# Patient Record
Sex: Female | Born: 1965 | Race: White | Hispanic: No | Marital: Married | Smoking: Never smoker
Health system: Southern US, Community
[De-identification: ages and names within clinical notes are randomized; demographics above are authoritative.]

## PROBLEM LIST (undated history)

## (undated) DIAGNOSIS — I10 Essential (primary) hypertension: Secondary | ICD-10-CM

## (undated) DIAGNOSIS — E119 Type 2 diabetes mellitus without complications: Secondary | ICD-10-CM

## (undated) DIAGNOSIS — E785 Hyperlipidemia, unspecified: Secondary | ICD-10-CM

## (undated) DIAGNOSIS — I639 Cerebral infarction, unspecified: Secondary | ICD-10-CM

## (undated) DIAGNOSIS — R569 Unspecified convulsions: Secondary | ICD-10-CM

## (undated) HISTORY — PX: CHOLECYSTECTOMY: SHX55

## (undated) HISTORY — PX: ABDOMINAL HYSTERECTOMY: SHX81

## (undated) HISTORY — PX: COLON SURGERY: SHX602

---

## 2016-11-06 ENCOUNTER — Encounter (HOSPITAL_COMMUNITY): Payer: Self-pay | Admitting: Emergency Medicine

## 2016-11-06 ENCOUNTER — Emergency Department (HOSPITAL_COMMUNITY)
Admission: EM | Admit: 2016-11-06 | Discharge: 2016-11-06 | Disposition: A | Discharge status: Home / Self Care | Attending: Emergency Medicine | Admitting: Emergency Medicine

## 2016-11-06 DIAGNOSIS — E1165 Type 2 diabetes mellitus with hyperglycemia: Secondary | ICD-10-CM | POA: Insufficient documentation

## 2016-11-06 DIAGNOSIS — I10 Essential (primary) hypertension: Secondary | ICD-10-CM | POA: Insufficient documentation

## 2016-11-06 DIAGNOSIS — Z7982 Long term (current) use of aspirin: Secondary | ICD-10-CM | POA: Insufficient documentation

## 2016-11-06 DIAGNOSIS — Z8673 Personal history of transient ischemic attack (TIA), and cerebral infarction without residual deficits: Secondary | ICD-10-CM | POA: Insufficient documentation

## 2016-11-06 DIAGNOSIS — R739 Hyperglycemia, unspecified: Secondary | ICD-10-CM

## 2016-11-06 DIAGNOSIS — Z7984 Long term (current) use of oral hypoglycemic drugs: Secondary | ICD-10-CM | POA: Insufficient documentation

## 2016-11-06 HISTORY — DX: Essential (primary) hypertension: I10

## 2016-11-06 HISTORY — DX: Unspecified convulsions: R56.9

## 2016-11-06 HISTORY — DX: Hyperlipidemia, unspecified: E78.5

## 2016-11-06 HISTORY — DX: Cerebral infarction, unspecified: I63.9

## 2016-11-06 HISTORY — DX: Type 2 diabetes mellitus without complications: E11.9

## 2016-11-06 LAB — URINALYSIS, ROUTINE W REFLEX MICROSCOPIC
Bilirubin Urine: NEGATIVE
Hgb urine dipstick: NEGATIVE
Ketones, ur: NEGATIVE mg/dL
LEUKOCYTES UA: NEGATIVE
Nitrite: NEGATIVE
PH: 6 (ref 5.0–8.0)
Protein, ur: NEGATIVE mg/dL
SPECIFIC GRAVITY, URINE: 1.024 (ref 1.005–1.030)

## 2016-11-06 LAB — BASIC METABOLIC PANEL
Anion gap: 9 (ref 5–15)
BUN: 16 mg/dL (ref 6–20)
CHLORIDE: 98 mmol/L — AB (ref 101–111)
CO2: 28 mmol/L (ref 22–32)
Calcium: 9.2 mg/dL (ref 8.9–10.3)
Creatinine, Ser: 0.78 mg/dL (ref 0.44–1.00)
GFR calc non Af Amer: >60 mL/min (ref >60)
Glucose, Bld: 332 mg/dL — ABNORMAL HIGH (ref 65–99)
POTASSIUM: 3.7 mmol/L (ref 3.5–5.1)
SODIUM: 135 mmol/L (ref 135–145)

## 2016-11-06 LAB — CBC
HEMATOCRIT: 38.9 % (ref 36.0–46.0)
HEMOGLOBIN: 12.7 g/dL (ref 12.0–15.0)
MCH: 28 pg (ref 26.0–34.0)
MCHC: 32.6 g/dL (ref 30.0–36.0)
MCV: 85.7 fL (ref 78.0–100.0)
Platelets: 224 10*3/uL (ref 150–400)
RBC: 4.54 MIL/uL (ref 3.87–5.11)
RDW: 14.9 % (ref 11.5–15.5)
WBC: 5.9 10*3/uL (ref 4.0–10.5)

## 2016-11-06 LAB — CBG MONITORING, ED
Glucose-Capillary: 330 mg/dL — ABNORMAL HIGH (ref 65–99)
Glucose-Capillary: 276 mg/dL — ABNORMAL HIGH (ref 65–99)

## 2016-11-06 LAB — URINE MICROSCOPIC-ADD ON: RBC / HPF: NONE SEEN RBC/hpf (ref 0–5)

## 2016-11-06 NOTE — ED Triage Notes (Signed)
Pt complaint of continued hyperglycemia for a few weeks; pt takes oral glycemic. Pt reports associated fatigue, thirst, urinary frequency.

## 2016-11-06 NOTE — ED Provider Notes (Signed)
[facility]-EMERGENCY DEPT Provider Note   CSN: [id number] Arrival date & time: 11/06/16  1843     History   Chief Complaint Chief Complaint  Patient presents with  . Hyperglycemia    HPI Bailey Mata is a 50 y.o. female.  The history is provided by the patient.  Hyperglycemia  Blood sugar level PTA:  500 Severity:  Moderate Onset quality:  Gradual Duration: several weeks. Timing:  Constant Progression:  Worsening Diabetes status:  Controlled with oral medications Current diabetic therapy:  Jauvia and glipizide Context: not change in medication   Relieved by:  Nothing Ineffective treatments:  Oral agents Associated symptoms: nausea   Associated symptoms: no abdominal pain, no chest pain, no dehydration, no dysuria, no fatigue, no fever, no shortness of breath and no vomiting     Past Medical History:  Diagnosis Date  . Diabetes mellitus without complication (HCC)   . Hyperlipemia   . Hypertension   . Seizures (HCC)   . Stroke [facility](HCC)     There are no active problems to display for this patient.   Past Surgical History:  Procedure Laterality Date  . ABDOMINAL HYSTERECTOMY    . COLON SURGERY      OB History    No data available       Home Medications    Prior to Admission medications   Medication Sig Start Date End Date Taking? Authorizing Provider  albuterol (PROVENTIL HFA;VENTOLIN HFA) 108 (90 Base) MCG/ACT inhaler Inhale 1-2 puffs into the lungs every 6 (six) hours as needed for wheezing or shortness of breath.   Yes Historical Provider, MD  aspirin 325 MG tablet Take 325 mg by mouth daily with breakfast.   Yes Historical Provider, MD  atorvastatin (LIPITOR) 80 MG tablet Take 80 mg by mouth at bedtime.   Yes Historical Provider, MD  cetirizine (ZYRTEC) 10 MG tablet Take 10 mg by mouth daily as needed for allergies.   Yes Historical Provider, MD  esomeprazole (NEXIUM) 40 MG capsule Take 40 mg by mouth daily with breakfast.   Yes Historical Provider, MD    fluticasone (FLONASE) 50 MCG/ACT nasal spray Place 1 spray into both nostrils daily as needed for allergies or rhinitis.   Yes Historical Provider, MD  glipiZIDE (GLUCOTROL XL) 10 MG 24 hr tablet Take 10 mg by mouth 2 (two) times daily.   Yes Historical Provider, MD  hydrochlorothiazide (HYDRODIURIL) 25 MG tablet Take 25 mg by mouth at bedtime.   Yes Historical Provider, MD  metoprolol succinate (TOPROL-XL) 50 MG 24 hr tablet Take 50 mg by mouth every evening. Take with or immediately following a meal.   Yes Historical Provider, MD  Multiple Vitamin (MULTIVITAMIN WITH MINERALS) TABS tablet Take 1 tablet by mouth daily with breakfast.   Yes Historical Provider, MD  Multiple Vitamins-Minerals (HAIR/SKIN/NAILS) TABS Take 2 tablets by mouth daily with breakfast.   Yes Historical Provider, MD  PARoxetine (PAXIL) 40 MG tablet Take 40 mg by mouth at bedtime.   Yes Historical Provider, MD  sitaGLIPtin (JANUVIA) 100 MG tablet Take 100 mg by mouth daily with breakfast.   Yes Historical Provider, MD  traZODone (DESYREL) 50 MG tablet Take 50 mg by mouth at bedtime.   Yes Historical Provider, MD    Family History No family history on file.  Social History Social History  Substance Use Topics  . Smoking status: Never Smoker  . Smokeless tobacco: Never Used  . Alcohol use No     Allergies   Dilaudid [hydromorphone]  and Wellbutrin [bupropion]   Review of Systems Review of Systems  Constitutional: Negative for chills, fatigue and fever.  HENT: Negative for ear pain and sore throat.   Eyes: Negative for pain and visual disturbance.  Respiratory: Negative for cough and shortness of breath.   Cardiovascular: Negative for chest pain and palpitations.  Gastrointestinal: Positive for nausea. Negative for abdominal pain and vomiting.  Genitourinary: Negative for dysuria and hematuria.  Musculoskeletal: Negative for arthralgias and back pain.  Skin: Negative for color change and rash.  Neurological:  Negative for seizures and syncope.  All other systems reviewed and are negative.    Physical Exam Updated Vital Signs BP 131/59 (BP Location: Right Arm)   Pulse 78   Temp 98.8 F (37.1 C) (Oral)   Resp 20   SpO2 100%   Physical Exam  Constitutional: She is oriented to person, place, and time. She appears well-developed and well-nourished. No distress.  HENT:  Head: Normocephalic and atraumatic.  Nose: Nose normal.  Eyes: Conjunctivae and EOM are normal. Pupils are equal, round, and reactive to light. Right eye exhibits no discharge. Left eye exhibits no discharge. No scleral icterus.  Neck: Normal range of motion. Neck supple.  Cardiovascular: Normal rate and regular rhythm.  Exam reveals no gallop and no friction rub.   No murmur heard. Pulmonary/Chest: Effort normal and breath sounds normal. No stridor. No respiratory distress. She has no rales.  Abdominal: Soft. She exhibits no distension. There is no tenderness.  Musculoskeletal: She exhibits no edema or tenderness.  Neurological: She is alert and oriented to person, place, and time.  Skin: Skin is warm and dry. No rash noted. She is not diaphoretic. No erythema.  Psychiatric: She has a normal mood and affect.  Vitals reviewed.    ED Treatments / Results  Labs (all labs ordered are listed, but only abnormal results are displayed) Labs Reviewed  BASIC METABOLIC PANEL - Abnormal; Notable for the following:       Result Value   Chloride 98 (*)    Glucose, Bld 332 (*)    All other components within normal limits  URINALYSIS, ROUTINE W REFLEX MICROSCOPIC (NOT AT [facility]) - Abnormal; Notable for the following:    Glucose, UA >1000 (*)    All other components within normal limits  URINE MICROSCOPIC-ADD ON - Abnormal; Notable for the following:    Squamous Epithelial / LPF 0-5 (*)    Bacteria, UA RARE (*)    All other components within normal limits  CBG MONITORING, ED - Abnormal; Notable for the following:     Glucose-Capillary 330 (*)    All other components within normal limits  CBG MONITORING, ED - Abnormal; Notable for the following:    Glucose-Capillary 276 (*)    All other components within normal limits  CBC  CBG MONITORING, ED    EKG  EKG Interpretation None       Radiology No results found.  Procedures Procedures (including critical care time)  Medications Ordered in ED Medications - No data to display   Initial Impression / Assessment and Plan / ED Course  I have reviewed the triage vital signs and the nursing notes.  Pertinent labs & imaging results that were available during my care of the patient were reviewed by me and considered in my medical decision making (see chart for details).  Clinical Course     No evidence of DKA or HHS. Glu down w/o intervention.   The patient is safe for  discharge with strict return precautions.   Final Clinical Impressions(s) / ED Diagnoses   Final diagnoses:  Hyperglycemia   Disposition: Discharge  Condition: Good  I have discussed the results, Dx and Tx plan with the patient who expressed understanding and agree(s) with the plan. Discharge instructions discussed at great length. The patient was given strict return precautions who verbalized understanding of the instructions. No further questions at time of discharge.    Discharge Medication List as of 11/06/2016  9:40 PM      Follow Up: Randell Loop, MD  Schedule an appointment as soon as possible for a visit on 11/09/2016       Nira Conn, MD 11/07/16 [phone]

## 2017-09-05 ENCOUNTER — Emergency Department (HOSPITAL_COMMUNITY)
Admission: EM | Admit: 2017-09-05 | Discharge: 2017-09-05 | Disposition: A | Discharge status: Home / Self Care | Attending: Emergency Medicine | Admitting: Emergency Medicine

## 2017-09-05 ENCOUNTER — Encounter (HOSPITAL_COMMUNITY): Payer: Self-pay | Admitting: *Deleted

## 2017-09-05 ENCOUNTER — Emergency Department (HOSPITAL_COMMUNITY)

## 2017-09-05 DIAGNOSIS — Y939 Activity, unspecified: Secondary | ICD-10-CM | POA: Diagnosis not present

## 2017-09-05 DIAGNOSIS — Y999 Unspecified external cause status: Secondary | ICD-10-CM | POA: Insufficient documentation

## 2017-09-05 DIAGNOSIS — S8001XA Contusion of right knee, initial encounter: Secondary | ICD-10-CM | POA: Insufficient documentation

## 2017-09-05 DIAGNOSIS — Y929 Unspecified place or not applicable: Secondary | ICD-10-CM | POA: Insufficient documentation

## 2017-09-05 DIAGNOSIS — I10 Essential (primary) hypertension: Secondary | ICD-10-CM | POA: Insufficient documentation

## 2017-09-05 DIAGNOSIS — E119 Type 2 diabetes mellitus without complications: Secondary | ICD-10-CM | POA: Diagnosis not present

## 2017-09-05 DIAGNOSIS — Z79899 Other long term (current) drug therapy: Secondary | ICD-10-CM | POA: Diagnosis not present

## 2017-09-05 DIAGNOSIS — W2209XA Striking against other stationary object, initial encounter: Secondary | ICD-10-CM | POA: Diagnosis not present

## 2017-09-05 DIAGNOSIS — S80911A Unspecified superficial injury of right knee, initial encounter: Secondary | ICD-10-CM | POA: Diagnosis present

## 2017-09-05 DIAGNOSIS — Z7982 Long term (current) use of aspirin: Secondary | ICD-10-CM | POA: Insufficient documentation

## 2017-09-05 MED ORDER — BACITRACIN ZINC 500 UNIT/GM EX OINT
TOPICAL_OINTMENT | Freq: Once | CUTANEOUS | Status: DC
  Filled 2017-09-05: qty 0.9

## 2017-09-05 MED ORDER — OXYCODONE-ACETAMINOPHEN 5-325 MG PO TABS
1.0000 | ORAL_TABLET | Freq: Once | ORAL | Status: AC
  Administered 2017-09-05: 1 via ORAL
  Filled 2017-09-05: qty 1

## 2017-09-05 MED ORDER — TRAMADOL HCL 50 MG PO TABS: 50.0000 mg | ORAL_TABLET | Freq: Four times a day (QID) | ORAL | 0 refills | Status: AC | PRN

## 2017-09-05 NOTE — Discharge Instructions (Signed)
The narcotic will make you sleepy so do not drive while taking it.
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
## 2017-09-05 NOTE — ED Notes (Signed)
Ortho called for knee sleeve placement and crutches.
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
## 2017-12-17 ENCOUNTER — Encounter (HOSPITAL_COMMUNITY): Payer: Self-pay

## 2017-12-17 ENCOUNTER — Other Ambulatory Visit: Payer: Self-pay

## 2017-12-17 ENCOUNTER — Emergency Department (HOSPITAL_COMMUNITY)

## 2017-12-17 ENCOUNTER — Emergency Department (HOSPITAL_COMMUNITY)
Admission: EM | Admit: 2017-12-17 | Discharge: 2017-12-17 | Disposition: A | Discharge status: Home / Self Care | Attending: Emergency Medicine | Admitting: Emergency Medicine

## 2017-12-17 DIAGNOSIS — Y33XXXA Other specified events, undetermined intent, initial encounter: Secondary | ICD-10-CM | POA: Insufficient documentation

## 2017-12-17 DIAGNOSIS — Z79899 Other long term (current) drug therapy: Secondary | ICD-10-CM | POA: Insufficient documentation

## 2017-12-17 DIAGNOSIS — E785 Hyperlipidemia, unspecified: Secondary | ICD-10-CM | POA: Diagnosis not present

## 2017-12-17 DIAGNOSIS — Y9289 Other specified places as the place of occurrence of the external cause: Secondary | ICD-10-CM | POA: Insufficient documentation

## 2017-12-17 DIAGNOSIS — Z794 Long term (current) use of insulin: Secondary | ICD-10-CM | POA: Insufficient documentation

## 2017-12-17 DIAGNOSIS — Z8673 Personal history of transient ischemic attack (TIA), and cerebral infarction without residual deficits: Secondary | ICD-10-CM | POA: Diagnosis not present

## 2017-12-17 DIAGNOSIS — S93402A Sprain of unspecified ligament of left ankle, initial encounter: Secondary | ICD-10-CM | POA: Insufficient documentation

## 2017-12-17 DIAGNOSIS — E119 Type 2 diabetes mellitus without complications: Secondary | ICD-10-CM | POA: Insufficient documentation

## 2017-12-17 DIAGNOSIS — I10 Essential (primary) hypertension: Secondary | ICD-10-CM | POA: Diagnosis not present

## 2017-12-17 DIAGNOSIS — Y939 Activity, unspecified: Secondary | ICD-10-CM | POA: Diagnosis not present

## 2017-12-17 DIAGNOSIS — S99912A Unspecified injury of left ankle, initial encounter: Secondary | ICD-10-CM | POA: Diagnosis present

## 2017-12-17 DIAGNOSIS — Y998 Other external cause status: Secondary | ICD-10-CM | POA: Diagnosis not present

## 2017-12-17 MED ORDER — ACETAMINOPHEN 325 MG PO TABS
650.0000 mg | ORAL_TABLET | Freq: Once | ORAL | Status: AC
  Administered 2017-12-17: 650 mg via ORAL
  Filled 2017-12-17: qty 2

## 2017-12-17 NOTE — Discharge Instructions (Signed)
Please see the information and instructions below regarding your visit.  Your diagnoses today include:  1. Sprain of left ankle, unspecified ligament, initial encounter    Your provider has diagnosed you as suffering from an ankle sprain. Ankle sprain occurs when the ligaments that hold the ankle joint together are stretched or torn. It may take 4 to 6 weeks to heal.  Tests performed today include: An x-ray of your ankle - does NOT show any broken bones  See side panel of your discharge paperwork for testing performed today. Vital signs are listed at the bottom of these instructions.   Medications prescribed:  Take any prescribed medications only as prescribed, and any over the counter medications only as directed on the packaging.  Please take Tylenol, 650 mg every 6 hours as needed for pain.  Ibuprofen can increase the risk of bleeding.  If you are feeling steady on your feet, you may combine this with aspirin.  May take 400 mg every 6 hours as needed alternating with Tylenol.  Home care instructions:  Follow R.I.C.E. Protocol: R - rest your injury  I  - use ice on injury without applying directly to skin C - compress injury with bandage or splint E - elevate the injury as much as possible  For Activity: Wear ankle brace for at least 2 weeks for stabilization of ankle. If prescribed crutches, use crutches with non-weight bearing for the first few days. Then, you may walk on your ankle as the pain allows, or as instructed. Start gradually with weight bearing on the affected ankle. Once you can walk pain free, then try jogging. When you can run forwards, then you can try moving side-to-side. If you cannot walk without crutches in one week, you need a re-check.  Please follow any educational materials contained in this packet.   Follow-up instructions: Please follow-up with your primary care provider or the provided orthopedic (bone specialist) listed in this packet if you continue to  have significant pain or trouble walking in 1 week. In this case you may have a severe sprain that requires further care.   Return instructions:  Please return if your toes are numb or tingling, appear gray or blue, are much colder than your other foot, or you have severe pain (also elevate leg and loosen splint or wrap). Please return to the Emergency Department if you experience worsening symptoms.  Please return if you have any other emergent concerns.  Additional Information:   Your vital signs today were: BP 122/62 (BP Location: Left Arm)    Pulse 96    Temp (!) 97.5 F (36.4 C) (Oral)    Resp 16    Ht 5\' 5"  (1.651 m)    Wt 74.4 kg (164 lb)    SpO2 97%    BMI 27.29 kg/m  If your blood pressure (BP) was elevated on multiple readings during this visit above 130 for the top number or above 80 for the bottom number, please have this repeated by your primary care provider within one month. --------------  Thank you for allowing us to participate in your care today.

## 2017-12-17 NOTE — ED Triage Notes (Signed)
Patient states she stepped off of the curb and twisted the left ankle yesterday. Pain is worse with movement and weight-bearing

## 2017-12-18 NOTE — ED Provider Notes (Signed)
[facility]-EMERGENCY DEPT Provider Note   CSN: [id number] Arrival date & time: 12/17/17  1641     History   Chief Complaint Chief Complaint  Patient presents with  . Ankle Injury    HPI Bailey Mata is a 52 y.o. female.  HPI  Patient is a 52 year old female with a history of type 2 diabetes mellitus (on anti-hyperglycemics), hyperlipidemia, hypertension, and stroke with residual left-sided weakness presenting for acute injury to the left ankle.  Patient reports that she was stepping off of a curb yesterday and twisted her left ankle and then inversion fashion.  No head injury or other injury in this incident.  No syncopal event preceded this. Patient does take 81 mg of aspirin daily.  Patient reports that she has been able to walk on it minimally with pain over the past 24 hours.  Patient has been using crutches from a prior injury with difficulty.  Patient reports she is unsteady on the crutches.  Patient reports that she has decreased sensation in the left lower extremity at baseline due to her stroke, however she does still have sensation of the foot.  Reports she is only able to minimally flex and extend the ankle due to the pain.  1 dose of ibuprofen tried yesterday for the symptoms.  Past Medical History:  Diagnosis Date  . Diabetes mellitus without complication (HCC)   . Hyperlipemia   . Hypertension   . Seizures (HCC)   . Stroke [facility](HCC)     There are no active problems to display for this patient.   Past Surgical History:  Procedure Laterality Date  . ABDOMINAL HYSTERECTOMY    . CHOLECYSTECTOMY    . COLON SURGERY      OB History    No data available       Home Medications    Prior to Admission medications   Medication Sig Start Date End Date Taking? Authorizing Provider  albuterol (PROVENTIL HFA;VENTOLIN HFA) 108 (90 Base) MCG/ACT inhaler Inhale 1-2 puffs into the lungs every 6 (six) hours as needed for wheezing or shortness of breath.    Yes [provider]  aspirin 81 MG tablet Take 81 mg by mouth daily with breakfast.    Yes [provider]  atorvastatin (LIPITOR) 80 MG tablet Take 80 mg by mouth at bedtime.   Yes [provider]  esomeprazole (NEXIUM) 40 MG capsule Take 40 mg by mouth daily with breakfast.   Yes [provider]  fluticasone (FLONASE) 50 MCG/ACT nasal spray Place 1 spray into both nostrils daily as needed for allergies or rhinitis.   Yes [provider]  gabapentin (NEURONTIN) 600 MG tablet Take 600 mg by mouth 3 (three) times daily. 11/19/17 11/19/18 Yes [provider]  Insulin Glargine (LANTUS SOLOSTAR) 100 UNIT/ML Solostar Pen Inject 50 Units into the skin at bedtime.   Yes [provider]  lisinopril (PRINIVIL,ZESTRIL) 2.5 MG tablet Take 2.5 mg by mouth at bedtime. 11/18/17  Yes [provider]  mirtazapine (REMERON SOL-TAB) 45 MG disintegrating tablet Take 45 mg by mouth at bedtime. 12/09/17  Yes [provider]  Multiple Vitamin (MULTIVITAMIN WITH MINERALS) TABS tablet Take 1 tablet by mouth daily with breakfast.   Yes [provider]  BYDUREON BCISE 2 MG/0.85ML AUIJ Inject 1 application into the skin every 7 (seven) days. On Tuesday 10/09/17   [provider]  sertraline (ZOLOFT) 50 MG tablet Take 100 mg by mouth at bedtime.    [provider]  traMADol (ULTRAM) 50 MG tablet Take 1 tablet (50 mg total) by mouth every 6 (six) hours as needed. 09/05/17   Janne Napoleon, NP    Family History Family History  Problem Relation Age of Onset  . Diabetes Mother   . Cancer Father     Social History Social History   Tobacco Use  . Smoking status: Never Smoker  . Smokeless tobacco: Never Used  Substance Use Topics  . Alcohol use: No  . Drug use: No     Allergies   Hydromorphone; Hydromorphone hcl; Bupropion; Sulfa antibiotics; and Sulfasalazine   Review of Systems Review of Systems    Musculoskeletal: Positive for arthralgias, gait problem and joint swelling.  Skin: Negative for wound.  Neurological: Negative for syncope, weakness and numbness.     Physical Exam Updated Vital Signs BP (!) 107/55 (BP Location: Left Arm)   Pulse 81   Temp 98.1 F (36.7 C) (Oral)   Resp 16   Ht 5\' 5"  (1.651 m)   Wt 74.4 kg (164 lb)   SpO2 96%   BMI 27.29 kg/m   Physical Exam  Constitutional: She appears well-developed and well-nourished. No distress.  Sitting comfortably in bed.  HENT:  Head: Normocephalic and atraumatic.  Eyes: Conjunctivae are normal. Right eye exhibits no discharge. Left eye exhibits no discharge.  EOMs normal to gross examination.  Neck: Normal range of motion.  Cardiovascular: Normal rate and regular rhythm.  Intact, 2+ DP pulse of the left lower extremity.  Unable to palpate PT pulse due to swelling.  Pulmonary/Chest:  Normal respiratory effort. Patient converses comfortably. No audible wheeze or stridor.  Abdominal: She exhibits no distension.  Musculoskeletal: Normal range of motion.  Left ankle with significant ecchymosis over the dorsum of the foot.  There is soft tissue swelling over the lateral malleolus.  Minimal soft tissue swelling over the medial malleolus.  Patient is point tender to palpation of the lateral malleolus.  No tenderness palpation over base of fifth meta tarsal, navicular, or medial malleolus.  Patient exhibits active range of motion with minimal flexion and extension due to pain.  Patient unable to invert or evert ankle due to pain.  There is no tenderness of the head of the fibula. DP pulse of the left lower extremity is 2+.  Compartments of the left lower extremity are soft.  Neurological: She is alert.  Cranial nerves intact to gross observation. Patient moves extremities without difficulty.  Skin: Skin is warm and dry. She is not diaphoretic.  Psychiatric: She has a normal mood and affect. Her behavior is normal. Judgment  and thought content normal.  Nursing note and vitals reviewed.    ED Treatments / Results  Labs (all labs ordered are listed, but only abnormal results are displayed) Labs Reviewed - No data to display  EKG  EKG Interpretation None       Radiology Dg Ankle Complete Left  Result Date: 12/17/2017 CLINICAL DATA:  Patient fell yesterday, rolling the ankle. Pain, swelling, and bruising of the ankle. EXAM: LEFT ANKLE COMPLETE - 3+ VIEW; LEFT FOOT - COMPLETE 3+ VIEW COMPARISON:  None. FINDINGS: Lateral soft tissue swelling over the left ankle. Dorsal soft tissue swelling over the left mid foot. No evidence of acute fracture or dislocation involving the left foot or the left ankle. No focal bone lesion or bone destruction. Bone cortex appears intact. No radiopaque soft tissue foreign bodies. IMPRESSION: Soft tissue swelling over the left foot and ankle. No  acute displaced fractures identified. Electronically Signed   By: Burman Nieves M.D.   On: 12/17/2017 18:55   Dg Foot Complete Left  Result Date: 12/17/2017 CLINICAL DATA:  Patient fell yesterday, rolling the ankle. Pain, swelling, and bruising of the ankle. EXAM: LEFT ANKLE COMPLETE - 3+ VIEW; LEFT FOOT - COMPLETE 3+ VIEW COMPARISON:  None. FINDINGS: Lateral soft tissue swelling over the left ankle. Dorsal soft tissue swelling over the left mid foot. No evidence of acute fracture or dislocation involving the left foot or the left ankle. No focal bone lesion or bone destruction. Bone cortex appears intact. No radiopaque soft tissue foreign bodies. IMPRESSION: Soft tissue swelling over the left foot and ankle. No acute displaced fractures identified. Electronically Signed   By: Burman Nieves M.D.   On: 12/17/2017 18:55    Procedures Procedures (including critical care time)  Medications Ordered in ED Medications  acetaminophen (TYLENOL) tablet 650 mg (650 mg Oral Given 12/17/17 1849)     Initial Impression / Assessment and Plan / ED  Course  I have reviewed the triage vital signs and the nursing notes.  Pertinent labs & imaging results that were available during my care of the patient were reviewed by me and considered in my medical decision making (see chart for details).     Final Clinical Impressions(s) / ED Diagnoses   Final diagnoses:  Sprain of left ankle, unspecified ligament, initial encounter   Patient is well-appearing in no acute distress.  X-ray of the left ankle demonstrates no acute fracture or avulsions.  Mortise is intact.  Left foot x-ray without bony abnormality.  Clinically, patient exhibits a likely significant sprain of the ankle.  Compartments are soft.  Due to patient's fall risk with crutches, and fall preceding this particular incident, I discussed the risk and benefit of Aircast and crutches with the patient.  I also discussed this with the orthopedic technician.  Decided upon Cam walker boot as the best way to stabilize the ankle until orthopedic follow-up.  Patient also given an Aircast to transition if needed.  Patient has previously seen orthopedic physician through [facility], and would like to return to this physician.  Patient to follow-up next week.  Return precautions given for any signs of compartment syndrome such as increasing pain, pallor, paresthesias, or increasing swelling.  Also counseled on patient with pain management with Tylenol.  Patient and her husband are in understanding and agree with the plan of care.  This is a supervised visit with Dr. Tilden Fossa. Evaluation, management, and discharge planning discussed with this attending physician.  ED Discharge Orders    None       Delia Chimes 12/18/17 0023    Tilden Fossa, MD 12/21/17 [id number]

## 2020-10-28 ENCOUNTER — Other Ambulatory Visit: Payer: Self-pay

## 2020-10-28 ENCOUNTER — Emergency Department (HOSPITAL_COMMUNITY)
Admission: EM | Admit: 2020-10-28 | Discharge: 2020-10-28 | Disposition: A | Discharge status: Home / Self Care | Attending: Emergency Medicine | Admitting: Emergency Medicine

## 2020-10-28 DIAGNOSIS — R0981 Nasal congestion: Secondary | ICD-10-CM

## 2020-10-28 DIAGNOSIS — J0110 Acute frontal sinusitis, unspecified: Secondary | ICD-10-CM | POA: Diagnosis not present

## 2020-10-28 DIAGNOSIS — E119 Type 2 diabetes mellitus without complications: Secondary | ICD-10-CM | POA: Diagnosis not present

## 2020-10-28 DIAGNOSIS — I10 Essential (primary) hypertension: Secondary | ICD-10-CM | POA: Insufficient documentation

## 2020-10-28 DIAGNOSIS — Z79899 Other long term (current) drug therapy: Secondary | ICD-10-CM | POA: Insufficient documentation

## 2020-10-28 DIAGNOSIS — H65194 Other acute nonsuppurative otitis media, recurrent, right ear: Secondary | ICD-10-CM

## 2020-10-28 DIAGNOSIS — Z7982 Long term (current) use of aspirin: Secondary | ICD-10-CM | POA: Insufficient documentation

## 2020-10-28 DIAGNOSIS — H65191 Other acute nonsuppurative otitis media, right ear: Secondary | ICD-10-CM | POA: Insufficient documentation

## 2020-10-28 DIAGNOSIS — H9211 Otorrhea, right ear: Secondary | ICD-10-CM | POA: Diagnosis present

## 2020-10-28 DIAGNOSIS — R519 Headache, unspecified: Secondary | ICD-10-CM

## 2020-10-28 MED ORDER — OFLOXACIN 0.3 % OT SOLN: 5.0000 [drp] | Freq: Two times a day (BID) | OTIC | 0 refills | Status: AC

## 2020-10-28 MED ORDER — AMOXICILLIN-POT CLAVULANATE 875-125 MG PO TABS: 1.0000 | ORAL_TABLET | Freq: Two times a day (BID) | ORAL | 0 refills | Status: AC

## 2020-10-28 NOTE — ED Notes (Signed)
Pt verbalized dc instructions and follow up care. Alert and ambulatory. No iv. 

## 2020-10-28 NOTE — ED Triage Notes (Signed)
Pt arrived via walk in, c/o right earache with drainage, cough, and post nasal drip and sinus pain. States she has a hx of bad sinus issues and infections as well as ear infections.

## 2020-10-28 NOTE — Discharge Instructions (Signed)
I am treating you with a antibiotic that will cover sinus infection and otitis media which is an inner ear infection. This antibiotic is called Augmentin.  Please take it as prescribed to be twice daily.  Please drink plenty of water.  Please do warm compresses of the face and ear.  Also recommending that you use eardrops that I prescribed you.  Please follow-up with the ear nose and throat doctor I have given you the phone number for their office.  If any new or worsening symptoms you may always return to the emergency department.   It is very important to follow-up with an ear nose and throat doctor.  I have provided you information for 1.  They are located here in [place].  Please return to the ER for any new or concerning symptoms as we discussed. We are being cautious about complications of mastoiditis because it can cause significant damage to your ear or severe infections.  You are at high risk for this because of your diabetes however because your diabetes is well controlled I am reassured by your physical exam and the story was explained to me.  Please use Tylenol or ibuprofen for pain.  You may use 600 mg ibuprofen every 6 hours or 1000 mg of Tylenol every 6 hours.  You may choose to alternate between the 2.  This would be most effective.  Not to exceed 4 g of Tylenol within 24 hours.  Not to exceed 3200 mg ibuprofen 24 hours.

## 2020-10-28 NOTE — ED Provider Notes (Signed)
[facility]   CSN: [id number] Arrival date & time: 10/28/20  1610     History Chief Complaint  Patient presents with  . Otalgia  . Facial Pain    Bailey Mata is a 54 y.o. female.  HPI Patient is a 54 year old female past medical history significant for controlled DM, HLD, and hypertension presented today with approximately 2 weeks of ear drainage, sinus congestion and mild cough.  She denies any fevers or chills.  She denies any known Covid exposure and states that she is vaccinated. She states that she feels also has a mild sore throat that she feels is from sinus drainage going down the back of her throat.  She also endorses associated headache which she thinks is from sinus congestion although it does occasionally seem to hurt in the back of her head and circumferentially as well.  These are intermittent headaches.  She states that she gets frequent ear infections and usually is treated with amoxicillin has complete resolution of her symptoms she also states that she seems to get sinusitis relatively frequently as well.  She [facility] to [facility] for her typical symptoms was into the emergency department for concerns for mastoiditis.  She denies any double vision, swelling behind her ear, swollen external ear.  She says that she has occasional crackling/popping sounds in her ear but she has had these in the past with her ear infections.  She states that she has seen ear nose and throat doctor years ago and they gave her some nasal spray that is similar to Flonase she cannot recall the name of this.  She states her diabetes is well under control and she uses her Lantus, Metformin and other diabetes regimen as prescribed.  She has no other associated symptoms.  No aggravating or mitigating factors.    Past Medical History:  Diagnosis Date  . Diabetes mellitus without complication  (HCC)   . Hyperlipemia   . Hypertension   . Seizures (HCC)   . Stroke [facility])     There are no problems to display for this patient.   Past Surgical History:  Procedure Laterality Date  . ABDOMINAL HYSTERECTOMY    . CHOLECYSTECTOMY    . COLON SURGERY       OB History   No obstetric history on file.     Family History  Problem Relation Age of Onset  . Diabetes Mother   . Cancer Father     Social History   Tobacco Use  . Smoking status: Never Smoker  . Smokeless tobacco: Never Used  Vaping Use  . Vaping Use: Never used  Substance Use Topics  . Alcohol use: No  . Drug use: No    Home Medications Prior to Admission medications   Medication Sig Start Date End Date Taking? Authorizing Provider  albuterol (PROVENTIL HFA;VENTOLIN HFA) 108 (90 Base) MCG/ACT inhaler Inhale 1-2 puffs into the lungs every 6 (six) hours as needed for wheezing or shortness of breath.    [provider]  amoxicillin-clavulanate (AUGMENTIN) 875-125 MG tablet Take 1 tablet by mouth every 12 (twelve) hours for 10 days. 10/28/20 11/07/20  Gailen Shelter, PA  aspirin 81 MG tablet Take 81 mg by mouth daily with breakfast.     [provider]  atorvastatin (LIPITOR) 80 MG tablet Take 80 mg by mouth at bedtime.    [provider]  BYDUREON BCISE 2 MG/0.85ML  AUIJ Inject 1 application into the skin every 7 (seven) days. On Tuesday 10/09/17   [provider]  esomeprazole (NEXIUM) 40 MG capsule Take 40 mg by mouth daily with breakfast.    [provider]  fluticasone (FLONASE) 50 MCG/ACT nasal spray Place 1 spray into both nostrils daily as needed for allergies or rhinitis.    [provider]  gabapentin (NEURONTIN) 600 MG tablet Take 600 mg by mouth 3 (three) times daily. 11/19/17 11/19/18  [provider]  Insulin Glargine (LANTUS SOLOSTAR) 100 UNIT/ML Solostar Pen Inject 50 Units into the skin at bedtime.    [provider]  lisinopril  (PRINIVIL,ZESTRIL) 2.5 MG tablet Take 2.5 mg by mouth at bedtime. 11/18/17   [provider]  mirtazapine (REMERON SOL-TAB) 45 MG disintegrating tablet Take 45 mg by mouth at bedtime. 12/09/17   [provider]  Multiple Vitamin (MULTIVITAMIN WITH MINERALS) TABS tablet Take 1 tablet by mouth daily with breakfast.    [provider]  ofloxacin (FLOXIN) 0.3 % OTIC solution Place 5 drops into the right ear 2 (two) times daily for 7 days. 10/28/20 11/04/20  Gailen Shelter, PA  sertraline (ZOLOFT) 50 MG tablet Take 100 mg by mouth at bedtime.    [provider]  traMADol (ULTRAM) 50 MG tablet Take 1 tablet (50 mg total) by mouth every 6 (six) hours as needed. 09/05/17   Janne Napoleon, NP    Allergies    Hydromorphone, Hydromorphone hcl, Bupropion, Sulfa antibiotics, and Sulfasalazine  Review of Systems   Review of Systems  Constitutional: Positive for fatigue. Negative for activity change, appetite change, chills and fever.  HENT: Positive for congestion, ear discharge, ear pain, postnasal drip, rhinorrhea, sinus pain and sore throat.   Eyes: Negative for redness.  Respiratory: Positive for cough.   Cardiovascular: Negative for chest pain.  Gastrointestinal: Negative for abdominal pain, diarrhea and nausea.  Genitourinary: Negative for dysuria.  Musculoskeletal: Negative for myalgias.  Skin: Negative for rash.  Neurological: Positive for headaches. Negative for dizziness.  Psychiatric/Behavioral: Negative for sleep disturbance.    Physical Exam Updated Vital Signs BP (!) 152/72 (BP Location: Right Arm)   Pulse (!) 101   Temp 98.5 F (36.9 C) (Oral)   Resp 16   Ht 5\' 5"  (1.651 m)   Wt 78.5 kg   SpO2 96%   BMI 28.79 kg/m   Physical Exam Vitals and nursing note reviewed.  Constitutional:      General: She is not in acute distress. HENT:     Head: Normocephalic and atraumatic.     Comments: Tenderness to palpation of the frontal sinuses.     Ears:     Comments: Right ear with cloudy TM and some mild erythema surrounding the TM with injection of the TM itself.  There was some bubbles behind the TM. Ear canal with some scant purulent drainage but is whitish in appearance.  Ear canal is patent however. External ear is normal there is no obliteration of the postauricular crease no protrusion of the auricle.  There is no swelling of the pinna or tragus.  There is some discomfort with manipulation of external ear some tenderness with palpation just behind the external ear but no focal bony tenderness of the mastoid bone.  There is no swelling or redness of this area either.    Nose: Nose normal.  Eyes:     General: No scleral icterus. Cardiovascular:     Rate and Rhythm: Normal rate  and regular rhythm.     Pulses: Normal pulses.     Heart sounds: Normal heart sounds.     Comments: HR 86 Pulmonary:     Effort: Pulmonary effort is normal. No respiratory distress.     Breath sounds: No wheezing.  Abdominal:     Palpations: Abdomen is soft.     Tenderness: There is no abdominal tenderness.  Musculoskeletal:     Cervical back: Normal range of motion.     Right lower leg: No edema.     Left lower leg: No edema.  Skin:    General: Skin is warm and dry.     Capillary Refill: Capillary refill takes less than 2 seconds.  Neurological:     Mental Status: She is alert. Mental status is at baseline.  Psychiatric:        Mood and Affect: Mood normal.        Behavior: Behavior normal.     ED Results / Procedures / Treatments   Labs (all labs ordered are listed, but only abnormal results are displayed) Labs Reviewed - No data to display  EKG None  Radiology No results found.  Procedures Procedures (including critical care time)  Medications Ordered in ED Medications - No data to display  ED Course  I have reviewed the triage vital signs and the nursing notes.  Pertinent labs & imaging results that were available during  my care of the patient were reviewed by me and considered in my medical decision making (see chart for details).    MDM Rules/Calculators/A&P                          Patient is a 54 year old female with past medical history of DM presented today with 2 weeks of ear drainage, nearly 2 weeks of sinus congestion and pressure ear popping headaches associated with congestion.  She also has mild associated cough which is dry nonproductive and she is vaccinated for Covid and does not want to be tested today.  Her physical exam is consistent with otitis media complicated by possible otitis externa.  This is a frequent issue for her.  I did consider mastoiditis and malignant otitis however it is less likely given her exam.  She has specifically no abnormal Pap findings, no significant erythema or edema of the mastoid and very minimal tenderness which seems to be more localized to the external ear loss of the mastoid bone itself.  She has no cranial nerve palsies.  I had shared decision-making conversation with patient.  She came to the ER because she was directed to by urgent care provider.  She is comfortable following up with ENT next couple days.  She is given strict return precautions for return to the emergency department.  We will go ahead and start patient on Augmentin and ofloxacin for treatment of bacterial sinusitis given that has been ongoing for 14 days and associated with significant frontal sinus tenderness.  We will also cover for otitis media which is questionable given the abnormal TM exam on the right side.  Ofloxacin given that she has evidence of otitis externa and has a history of frequent infections.  She is agreeable to plan to follow-up with ear nose and throat.  She is also given Tylenol and ibuprofen recommendations. Also printed discharge instructions that included highlighted return precautions.  At discharge her vital signs were within normal limits with heart rate of 86 SPO2  within normal limits  she has had very mild hypertension with elevated blood pressure.  Likely secondary to some discomfort.  In triage she had a heart rate of 101 however this is a pleasant my examination.  Final Clinical Impression(s) / ED Diagnoses Final diagnoses:  Other recurrent acute nonsuppurative otitis media of right ear  Sinus congestion  Sinus headache  Acute non-recurrent frontal sinusitis    Rx / DC Orders ED Discharge Orders         Ordered    ofloxacin (FLOXIN) 0.3 % OTIC solution  2 times daily        10/28/20 1709    amoxicillin-clavulanate (AUGMENTIN) 875-125 MG tablet  Every 12 hours        10/28/20 1709           Gailen ShelterFondaw, Coulter Oldaker S, [place]PA 10/28/20 1748    Sabas SousBero, Michael M, MD 10/28/20 2349
